# Patient Record
Sex: Female | Born: 1990 | Race: White | Hispanic: No | Marital: Single | Smoking: Former smoker
Health system: Southern US, Community
[De-identification: ages and names within clinical notes are randomized; demographics above are authoritative.]

## PROBLEM LIST (undated history)

## (undated) DIAGNOSIS — U071 COVID-19: Secondary | ICD-10-CM

## (undated) HISTORY — PX: CHOLECYSTECTOMY: SHX55

---

## 2018-08-25 ENCOUNTER — Other Ambulatory Visit: Payer: Self-pay

## 2018-08-25 ENCOUNTER — Emergency Department
Admission: EM | Admit: 2018-08-25 | Discharge: 2018-08-25 | Disposition: A | Discharge status: Home / Self Care | Payer: Self-pay | Attending: Emergency Medicine | Admitting: Emergency Medicine

## 2018-08-25 DIAGNOSIS — B9789 Other viral agents as the cause of diseases classified elsewhere: Secondary | ICD-10-CM | POA: Insufficient documentation

## 2018-08-25 DIAGNOSIS — H6123 Impacted cerumen, bilateral: Secondary | ICD-10-CM | POA: Insufficient documentation

## 2018-08-25 DIAGNOSIS — J069 Acute upper respiratory infection, unspecified: Secondary | ICD-10-CM | POA: Insufficient documentation

## 2018-08-25 MED ORDER — PSEUDOEPH-BROMPHEN-DM 30-2-10 MG/5ML PO SYRP: 5.0000 mL | ORAL_SOLUTION | Freq: Four times a day (QID) | ORAL | 0 refills | Status: AC | PRN

## 2018-08-25 MED ORDER — LIDOCAINE VISCOUS HCL 2 % MT SOLN: 10.0000 mL | OROMUCOSAL | 0 refills | Status: AC | PRN

## 2018-08-25 MED ORDER — CARBAMIDE PEROXIDE 6.5 % OT SOLN: 5.0000 [drp] | Freq: Two times a day (BID) | OTIC | 2 refills | Status: DC

## 2018-08-25 MED ORDER — FLUTICASONE PROPIONATE 50 MCG/ACT NA SUSP: 2.0000 | Freq: Every day | NASAL | 0 refills | Status: DC

## 2018-08-25 NOTE — ED Provider Notes (Signed)
[facility] Emergency Department Provider Note  ____________________________________________  Time seen: Approximately 10:49 AM  I have reviewed the triage vital signs and the nursing notes.   HISTORY  Chief Complaint URI    HPI Carolyn Peters is a 27 y.o. female that presents to the emergency department for evaluation of nasal congestion, sore throat, and cough for 2 days. She has felt warm but not checked her temperature. Her left ear feels clogged. No asthma or allergies. She denies headache, SOB, vomiting, body aches.    No past medical history on file.  There are no active problems to display for this patient.    Prior to Admission medications   Medication Sig Start Date End Date Taking? Authorizing Provider  brompheniramine-pseudoephedrine-DM 30-2-10 MG/5ML syrup Take 5 mLs by mouth 4 (four) times daily as needed. 08/25/18   Laban Emperor, PA-C  carbamide peroxide (DEBROX) 6.5 % OTIC solution Place 5 drops into the right ear 2 (two) times daily. 08/25/18 08/25/19  Laban Emperor, PA-C  fluticasone (FLONASE) 50 MCG/ACT nasal spray Place 2 sprays into both nostrils daily. 08/25/18 08/25/19  Laban Emperor, PA-C  lidocaine (XYLOCAINE) 2 % solution Use as directed 10 mLs in the mouth or throat as needed for mouth pain. 08/25/18   Laban Emperor, PA-C    Allergies Patient has no known allergies.  No family history on file.  Social History Social History   Tobacco Use  . Smoking status: Not on file  Substance Use Topics  . Alcohol use: Not on file  . Drug use: Not on file     Review of Systems  Eyes: No visual changes. No discharge. ENT: Positive for congestion and rhinorrhea. Cardiovascular: No chest pain. Respiratory: Positive for cough. No SOB. Gastrointestinal: No abdominal pain.  No nausea, no vomiting.  No diarrhea.  No constipation. Musculoskeletal: Negative for musculoskeletal pain. Skin: Negative for rash, abrasions, lacerations,  ecchymosis. Neurological: Negative for headaches.   ____________________________________________   PHYSICAL EXAM:  VITAL SIGNS: ED Triage Vitals  Enc Vitals Group     BP 08/25/18 1035 140/73     Pulse Rate 08/25/18 1035 78     Resp 08/25/18 1035 18     Temp 08/25/18 1035 97.7 F (36.5 C)     Temp Source 08/25/18 1035 Oral     SpO2 08/25/18 1035 99 %     Weight 08/25/18 1035 105 lb (47.6 kg)     Height 08/25/18 1035 5\' 2"  (1.575 m)     Head Circumference --      Peak Flow --      Pain Score 08/25/18 1034 5     Pain Loc --      Pain Edu? --      Excl. in [place]? --      Constitutional: Alert and oriented. Well appearing and in no acute distress. Eyes: Conjunctivae are normal. PERRL. EOMI. No discharge. Head: Atraumatic. ENT: No frontal and maxillary sinus tenderness.      Ears: Right tympanic membrane pink. Left tympanic membrane not visualized. Cerumen to bilateral ear canals.      Nose: Mild congestion/rhinnorhea.      Mouth/Throat: Mucous membranes are moist. Oropharynx erythematous. Tonsils not enlarged. No exudates. Uvula midline. Neck: No stridor.   Hematological/Lymphatic/Immunilogical: No cervical lymphadenopathy. Cardiovascular: Normal rate, regular rhythm.  Good peripheral circulation. Respiratory: Normal respiratory effort without tachypnea or retractions. Lungs CTAB. Good air entry to the bases with no decreased or absent breath sounds. Gastrointestinal: Bowel sounds 4 quadrants.  Soft and nontender to palpation. No guarding or rigidity. No palpable masses. No distention. Musculoskeletal: Full range of motion to all extremities. No gross deformities appreciated. Neurologic:  Normal speech and language. No gross focal neurologic deficits are appreciated.  Skin:  Skin is warm, dry and intact. No rash noted. Psychiatric: Mood and affect are normal. Speech and behavior are normal. Patient exhibits appropriate insight and  judgement.   ____________________________________________   LABS (all labs ordered are listed, but only abnormal results are displayed)  Labs Reviewed - No data to display ____________________________________________  EKG   ____________________________________________  RADIOLOGY   No results found.  ____________________________________________    PROCEDURES  Procedure(s) performed:    Procedures    Medications - No data to display   ____________________________________________   INITIAL IMPRESSION / ASSESSMENT AND PLAN / ED COURSE  Pertinent labs & imaging results that were available during my care of the patient were reviewed by me and considered in my medical decision making (see chart for details).  Review of the [place] CSRS was performed in accordance of the [place] prior to dispensing any controlled drugs.     Patient's diagnosis is consistent with viral URI. Vital signs and exam are reassuring. Patient appears well and is staying well hydrated. Patient should alternate tylenol and ibuprofen for fever. Patient feels comfortable going home. Patient will be discharged home with prescriptions for bromfed, flonase, debrox, lidocaine. Patient is to follow up with primary care as needed or otherwise directed. Patient is given ED precautions to return to the ED for any worsening or new symptoms.     ____________________________________________  FINAL CLINICAL IMPRESSION(S) / ED DIAGNOSES  Final diagnoses:  Viral URI with cough  Bilateral impacted cerumen      NEW MEDICATIONS STARTED DURING THIS VISIT:  ED Discharge Orders         Ordered    brompheniramine-pseudoephedrine-DM 30-2-10 MG/5ML syrup  4 times daily PRN     08/25/18 1059    fluticasone (FLONASE) 50 MCG/ACT nasal spray  Daily     08/25/18 1059    carbamide peroxide (DEBROX) 6.5 % OTIC solution  2 times daily     08/25/18 1059    lidocaine (XYLOCAINE) 2 % solution  As needed     08/25/18  1059              This chart was dictated using voice recognition software/Dragon. Despite best efforts to proofread, errors can occur which can change the meaning. Any change was purely unintentional.    Laban Emperor, PA-C 08/25/18 1659    Lavonia Drafts, MD 08/27/18 1616

## 2018-08-25 NOTE — ED Triage Notes (Signed)
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

## 2018-08-25 NOTE — ED Notes (Signed)
See triage note  Presents with sinus drainage and sore throat and subjective fever  Afebrile on arrival

## 2018-12-23 ENCOUNTER — Other Ambulatory Visit: Payer: Self-pay

## 2018-12-23 ENCOUNTER — Encounter (INDEPENDENT_AMBULATORY_CARE_PROVIDER_SITE_OTHER): Payer: Self-pay

## 2018-12-23 ENCOUNTER — Ambulatory Visit
Admission: RE | Admit: 2018-12-23 | Discharge: 2018-12-23 | Disposition: A | Discharge status: Home / Self Care | Payer: Self-pay | Source: Ambulatory Visit | Attending: Oncology | Admitting: Oncology

## 2018-12-23 ENCOUNTER — Ambulatory Visit: Payer: Self-pay | Attending: Oncology | Admitting: *Deleted

## 2018-12-23 ENCOUNTER — Encounter: Payer: Self-pay | Admitting: *Deleted

## 2018-12-23 VITALS — BP 120/87 | HR 87 | Temp 98.1°F | Ht 64.0 in | Wt 120.5 lb

## 2018-12-23 DIAGNOSIS — N63 Unspecified lump in unspecified breast: Secondary | ICD-10-CM

## 2018-12-23 NOTE — Patient Instructions (Signed)
Gave patient hand-out, Women Staying Healthy, Active and Well from [facility], with education on breast health, pap smears, heart and colon health. 

## 2018-12-23 NOTE — Progress Notes (Signed)
  Subjective:     Patient ID: Carolyn Peters, female   DOB: 1991-01-18, 28 y.o.   MRN: [id number]  HPI   Review of Systems     Objective:   Physical Exam Chest:     Breasts:        Right: No swelling, bleeding, inverted nipple, mass, nipple discharge, skin change or tenderness.        Left: Mass present. No swelling, bleeding, inverted nipple, nipple discharge, skin change or tenderness.    Lymphadenopathy:     Upper Body:     Right upper body: No supraclavicular or axillary adenopathy.     Left upper body: No supraclavicular or axillary adenopathy.        Assessment:     28 year old White female referred to [facility] by Jerline Pain, FNP at the [facility] Department for further evaluation of a left breast mass.  Patient states she had not felt the lump until found on clinical exam on 12/04/18.  States it is unchanged since then.  Family history includes maternal and paternal grandmother with breast cancer.  On clinical breast exam today I can palpate an approximate 0.5 cm firm, mobile nodule at 6:00 left breast at the edge of the areola.  Taught self breast awareness.  Patient has been screened for eligibility.  She does not have any insurance, Medicare or Medicaid.  She also meets financial eligibility.  Hand-out given on the Affordable Care Act.    Plan:     Ultrasound of the left breast ordered.  Will follow up per [facility] protocol and radiology recommendations.

## 2018-12-31 ENCOUNTER — Encounter: Payer: Self-pay | Admitting: *Deleted

## 2018-12-31 NOTE — Progress Notes (Signed)
Called patient and reviewed her ultrasound results.  She states the left breast mass is still there and is tender to palpation.  Offered surgical consult for second opinion.  States she has Medicaid now.  She can be billed through her Medicaid card.  Appointment scheduled to see Dr. Bary Castilla on 01/05/19 @ 3:00.  She is to take a photo ID, all meds and arrive 15 minutes early.  She is agreeable.

## 2019-01-05 ENCOUNTER — Ambulatory Visit (INDEPENDENT_AMBULATORY_CARE_PROVIDER_SITE_OTHER): Payer: Medicaid Other | Admitting: General Surgery

## 2019-01-05 ENCOUNTER — Other Ambulatory Visit: Payer: Self-pay

## 2019-01-05 ENCOUNTER — Encounter: Payer: Self-pay | Admitting: General Surgery

## 2019-01-05 VITALS — BP 121/83 | HR 69 | Temp 98.6°F | Resp 16 | Ht 63.0 in | Wt 121.0 lb

## 2019-01-05 DIAGNOSIS — N6323 Unspecified lump in the left breast, lower outer quadrant: Secondary | ICD-10-CM | POA: Diagnosis not present

## 2019-01-05 DIAGNOSIS — N6320 Unspecified lump in the left breast, unspecified quadrant: Secondary | ICD-10-CM

## 2019-01-05 NOTE — Patient Instructions (Addendum)
Please call our office if you have questions or concerns.    Breast Self-Awareness Breast self-awareness means:  Knowing how your breasts look.  Knowing how your breasts feel.  Checking your breasts every month for changes.  Telling your doctor if you notice a change in your breasts. Breast self-awareness allows you to notice a breast problem early while it is still small. How to do a breast self-exam One way to learn what is normal for your breasts and to check for changes is to do a breast self-exam. To do a breast self-exam: Look for Changes  1. Take off all the clothes above your waist. 2. Stand in front of a mirror in a room with good lighting. 3. Put your hands on your hips. 4. Push your hands down. 5. Look at your breasts and nipples in the mirror to see if one breast or nipple looks different than the other. Check to see if: ? The shape of one breast is different. ? The size of one breast is different. ? There are wrinkles, dips, and bumps in one breast and not the other. 6. Look at each breast for changes in your skin, such as: ? Redness. ? Scaly areas. 7. Look for changes in your nipples, such as: ? Liquid around the nipples. ? Bleeding. ? Dimpling. ? Redness. ? A change in where the nipples are. Feel for Changes 1. Lie on your back on the floor. 2. Feel each breast. To do this, follow these steps: ? Pick a breast to feel. ? Put the arm closest to that breast above your head. ? Use your other arm to feel the nipple area of your breast. Feel the area with the pads of your three middle fingers by making small circles with your fingers. For the first circle, press lightly. For the second circle, press harder. For the third circle, press even harder. ? Keep making circles with your fingers at the light, harder, and even harder pressures as you move down your breast. Stop when you feel your ribs. ? Move your fingers a little toward the center of your body. ? Start  making circles with your fingers again, this time going up until you reach your collarbone. ? Keep making up and down circles until you reach your armpit. Remember to keep using the three pressures. ? Feel the other breast in the same way. 3. Sit or stand in the shower or tub. 4. With soapy water on your skin, feel each breast the same way you did in step 2, when you were lying on the floor. Write Down What You Find After doing the self-exam, write down:  What is normal for each breast.  Any changes you find in each breast.  When you last had your period.  How often should I check my breasts? Check your breasts every month. If you are breastfeeding, the best time to check them is after you feed your baby or after you use a breast pump. If you get periods, the best time to check your breasts is 5-7 days after your period is over. When should I see my doctor? See your doctor if you notice:  A change in shape or size of your breasts or nipples.  A change in the skin of your breast or nipples, such as red or scaly skin.  Unusual fluid coming from your nipples.  A lump or thick area that was not there before.  Pain in your breasts.  Anything that concerns you.  This information is not intended to replace advice given to you by your health care provider. Make sure you discuss any questions you have with your health care provider. Document Released: 03/25/2008 Document Revised: 03/14/2016 Document Reviewed: 08/27/2015 Elsevier Interactive Patient Education  2019 [employer].

## 2019-01-05 NOTE — Progress Notes (Signed)
Patient ID: Carolyn Peters, female   DOB: [birth year], 28 y.o.   MRN: [id number]  Chief Complaint  Patient presents with  . Other    left breast mass    HPI Carolyn Peters is a 28 y.o. female.  Here today for left breast mass. Tender to touch. No nipple drainage. No skin changes or dimpling. She just recently found it. She does not do self breast exams regularly.  Both Paternal and Maternal grandmother history of breast cancer.  HPI  History reviewed. No pertinent past medical history.  History reviewed. No pertinent surgical history.  History reviewed. No pertinent family history.  Social History Social History   Tobacco Use  . Smoking status: Current Every Day Smoker    Packs/day: 1.00    Years: 15.00    Pack years: 15.00  . Smokeless tobacco: Never Used  Substance Use Topics  . Alcohol use: Yes  . Drug use: Never    No Known Allergies  Current Outpatient Medications  Medication Sig Dispense Refill  . brompheniramine-pseudoephedrine-DM 30-2-10 MG/5ML syrup Take 5 mLs by mouth 4 (four) times daily as needed. 120 mL 0  . lidocaine (XYLOCAINE) 2 % solution Use as directed 10 mLs in the mouth or throat as needed for mouth pain. 100 mL 0   No current facility-administered medications for this visit.     Review of Systems Review of Systems  Constitutional: Negative.   Respiratory: Negative.   Cardiovascular: Negative.     Blood pressure 121/83, pulse 69, temperature 98.6 F (37 C), temperature source Temporal, resp. rate 16, height 5\' 3"  (1.6 m), weight 121 lb (54.9 kg), last menstrual period 12/07/2018, SpO2 98 %.  Physical Exam Physical Exam Exam conducted with a chaperone present.  Constitutional:      Appearance: She is well-developed.  Eyes:     General: No scleral icterus.    Conjunctiva/sclera: Conjunctivae normal.  Neck:     Musculoskeletal: Normal range of motion.  Cardiovascular:     Rate and Rhythm: Normal rate and regular rhythm.     Heart  sounds: Normal heart sounds.  Pulmonary:     Effort: Pulmonary effort is normal.     Breath sounds: Normal breath sounds.  Chest:    Lymphadenopathy:     Cervical: No cervical adenopathy.  Skin:    General: Skin is warm and dry.  Neurological:     Mental Status: She is alert and oriented to person, place, and time.     Data Reviewed Breast ultrasound dated December 23, 2018 was reported as negative.  No images for review.  Assessment Minimally asymptomatic breast parenchyma and a very thin individual, no suspicion for a pathologic process.  Plan  The patient is encourage to continue d regular self examination.  Avoidance of manipulation of this area to minimize enlargement or anxiety encouraged.    HPI, Physical Exam, Assessment and Plan have been scribed under the direction and in the presence of Carolyn Bellow, MD. Carolyn Peters, CMA  I have completed the exam and reviewed the above documentation for accuracy and completeness.  I agree with the above.  Carolyn Peters has been used and any errors in dictation or transcription are unintentional.  Carolyn Peters, M.D., F.A.C.S.  Carolyn Peters 01/06/2019, 2:51 PM

## 2019-01-06 DIAGNOSIS — N6320 Unspecified lump in the left breast, unspecified quadrant: Secondary | ICD-10-CM | POA: Insufficient documentation

## 2019-01-19 ENCOUNTER — Encounter: Payer: Self-pay | Admitting: *Deleted

## 2019-01-19 NOTE — Progress Notes (Signed)
Benign findings per Dr. Bary Castilla.  Follow up as needed.  [facility] to [facility].

## 2019-10-26 ENCOUNTER — Other Ambulatory Visit: Payer: Self-pay | Admitting: Family Medicine

## 2019-10-26 DIAGNOSIS — N644 Mastodynia: Secondary | ICD-10-CM

## 2019-10-26 DIAGNOSIS — N6320 Unspecified lump in the left breast, unspecified quadrant: Secondary | ICD-10-CM

## 2019-10-26 DIAGNOSIS — N6452 Nipple discharge: Secondary | ICD-10-CM

## 2019-10-28 ENCOUNTER — Other Ambulatory Visit: Payer: Self-pay | Admitting: Family Medicine

## 2019-10-28 DIAGNOSIS — R7989 Other specified abnormal findings of blood chemistry: Secondary | ICD-10-CM

## 2019-11-03 ENCOUNTER — Ambulatory Visit
Admission: RE | Admit: 2019-11-03 | Discharge: 2019-11-03 | Disposition: A | Discharge status: Home / Self Care | Payer: Medicaid Other | Source: Ambulatory Visit | Attending: Family Medicine | Admitting: Family Medicine

## 2019-11-03 ENCOUNTER — Other Ambulatory Visit: Payer: Self-pay

## 2019-11-03 DIAGNOSIS — R7989 Other specified abnormal findings of blood chemistry: Secondary | ICD-10-CM | POA: Diagnosis present

## 2019-11-09 ENCOUNTER — Encounter: Payer: Self-pay | Admitting: Radiology

## 2019-11-09 ENCOUNTER — Ambulatory Visit
Admission: RE | Admit: 2019-11-09 | Discharge: 2019-11-09 | Disposition: A | Discharge status: Home / Self Care | Payer: Medicaid Other | Source: Ambulatory Visit | Attending: Family Medicine | Admitting: Family Medicine

## 2019-11-09 DIAGNOSIS — N6452 Nipple discharge: Secondary | ICD-10-CM

## 2019-11-09 DIAGNOSIS — N644 Mastodynia: Secondary | ICD-10-CM

## 2019-11-09 DIAGNOSIS — N6325 Unspecified lump in the left breast, overlapping quadrants: Secondary | ICD-10-CM | POA: Insufficient documentation

## 2019-11-09 DIAGNOSIS — N6320 Unspecified lump in the left breast, unspecified quadrant: Secondary | ICD-10-CM

## 2020-06-16 ENCOUNTER — Other Ambulatory Visit: Payer: Self-pay

## 2020-06-16 ENCOUNTER — Emergency Department: Payer: Medicaid Other

## 2020-06-16 DIAGNOSIS — N644 Mastodynia: Secondary | ICD-10-CM | POA: Insufficient documentation

## 2020-06-16 DIAGNOSIS — M542 Cervicalgia: Secondary | ICD-10-CM | POA: Insufficient documentation

## 2020-06-16 DIAGNOSIS — Z5321 Procedure and treatment not carried out due to patient leaving prior to being seen by health care provider: Secondary | ICD-10-CM | POA: Diagnosis not present

## 2020-06-16 DIAGNOSIS — R0602 Shortness of breath: Secondary | ICD-10-CM | POA: Insufficient documentation

## 2020-06-16 LAB — COMPREHENSIVE METABOLIC PANEL
ALT: 43 U/L (ref 0–44)
AST: 36 U/L (ref 15–41)
Albumin: 4.1 g/dL (ref 3.5–5.0)
Alkaline Phosphatase: 114 U/L (ref 38–126)
Anion gap: 9 (ref 5–15)
BUN: 6 mg/dL (ref 6–20)
CO2: 24 mmol/L (ref 22–32)
Calcium: 8.5 mg/dL — ABNORMAL LOW (ref 8.9–10.3)
Chloride: 105 mmol/L (ref 98–111)
Creatinine, Ser: 0.72 mg/dL (ref 0.44–1.00)
GFR calc Af Amer: >60 mL/min (ref >60)
GFR calc non Af Amer: >60 mL/min (ref >60)
Glucose, Bld: 93 mg/dL (ref 70–99)
Potassium: 3.2 mmol/L — ABNORMAL LOW (ref 3.5–5.1)
Sodium: 138 mmol/L (ref 135–145)
Total Bilirubin: 0.6 mg/dL (ref 0.3–1.2)
Total Protein: 7 g/dL (ref 6.5–8.1)

## 2020-06-16 LAB — TROPONIN I (HIGH SENSITIVITY): Troponin I (High Sensitivity): 4 ng/L (ref <18)

## 2020-06-16 LAB — FIBRIN DERIVATIVES D-DIMER (ARMC ONLY): Fibrin derivatives D-dimer (ARMC): 405.9 ng/mL (FEU) (ref 0.00–499.00)

## 2020-06-16 LAB — CBC
HCT: 43.4 % (ref 36.0–46.0)
Hemoglobin: 14.3 g/dL (ref 12.0–15.0)
MCH: 31.8 pg (ref 26.0–34.0)
MCHC: 32.9 g/dL (ref 30.0–36.0)
MCV: 96.4 fL (ref 80.0–100.0)
Platelets: 390 10*3/uL (ref 150–400)
RBC: 4.5 MIL/uL (ref 3.87–5.11)
RDW: 13.2 % (ref 11.5–15.5)
WBC: 14 10*3/uL — ABNORMAL HIGH (ref 4.0–10.5)
nRBC: 0 % (ref 0.0–0.2)

## 2020-06-16 NOTE — ED Triage Notes (Signed)
Pt states she is having pain in left breast and under left breast with shob since yesterday. Pt denies vomiting, states pain extends up to left lateral neck. Pt denies vomiting. Pt states she does feel shob.

## 2020-06-17 ENCOUNTER — Emergency Department
Admission: EM | Admit: 2020-06-17 | Discharge: 2020-06-17 | Disposition: A | Discharge status: Home / Self Care | Payer: Medicaid Other | Attending: Emergency Medicine | Admitting: Emergency Medicine

## 2020-09-22 ENCOUNTER — Emergency Department
Admission: EM | Admit: 2020-09-22 | Discharge: 2020-09-22 | Disposition: A | Discharge status: Home / Self Care | Payer: Medicaid Other | Attending: Emergency Medicine | Admitting: Emergency Medicine

## 2020-09-22 ENCOUNTER — Other Ambulatory Visit: Payer: Self-pay

## 2020-09-22 ENCOUNTER — Emergency Department: Payer: Medicaid Other

## 2020-09-22 ENCOUNTER — Encounter: Payer: Self-pay | Admitting: Emergency Medicine

## 2020-09-22 DIAGNOSIS — C715 Malignant neoplasm of cerebral ventricle: Secondary | ICD-10-CM | POA: Insufficient documentation

## 2020-09-22 DIAGNOSIS — I517 Cardiomegaly: Secondary | ICD-10-CM | POA: Diagnosis not present

## 2020-09-22 DIAGNOSIS — U071 COVID-19: Secondary | ICD-10-CM | POA: Insufficient documentation

## 2020-09-22 DIAGNOSIS — Z87891 Personal history of nicotine dependence: Secondary | ICD-10-CM | POA: Diagnosis not present

## 2020-09-22 DIAGNOSIS — Q208 Other congenital malformations of cardiac chambers and connections: Secondary | ICD-10-CM

## 2020-09-22 DIAGNOSIS — R519 Headache, unspecified: Secondary | ICD-10-CM | POA: Diagnosis present

## 2020-09-22 LAB — RESP PANEL BY RT-PCR (FLU A&B, COVID) ARPGX2
Influenza A by PCR: NEGATIVE
Influenza B by PCR: NEGATIVE
SARS Coronavirus 2 by RT PCR: POSITIVE — AB

## 2020-09-22 MED ORDER — ONDANSETRON 8 MG PO TBDP
8.0000 mg | ORAL_TABLET | Freq: Once | ORAL | Status: AC
  Administered 2020-09-22: 8 mg via ORAL
  Filled 2020-09-22: qty 1

## 2020-09-22 MED ORDER — ACETAMINOPHEN 500 MG PO TABS
1000.0000 mg | ORAL_TABLET | Freq: Once | ORAL | Status: AC
  Administered 2020-09-22: 1000 mg via ORAL
  Filled 2020-09-22: qty 2

## 2020-09-22 NOTE — ED Triage Notes (Signed)
Patient ambulatory to triage with steady gait, without difficulty or distress noted; pt reports HA since yesterday accomp by nausea and chills

## 2020-09-22 NOTE — Discharge Instructions (Signed)
Follow discharge care instructions. 

## 2020-09-22 NOTE — ED Provider Notes (Signed)
[facility] Emergency Department Provider Note   ____________________________________________   First MD Initiated Contact with Patient 09/22/20 [phone]     (approximate)  I have reviewed the triage vital signs and the nursing notes.   HISTORY  Chief Complaint No chief complaint on file.    HPI Carolyn Peters is a 29 y.o. female patient presents with increasing headache over the past 4 days.  Patient is a headache associated with nausea and vomiting.  Patient states fever, chills, and body aches.  Denies recent travel or known contact with COVID-19. Patient has not taken COVID-19 vaccine.  History reviewed. No pertinent past medical history.  Patient Active Problem List   Diagnosis Date Noted  . Breast mass, left 01/06/2019    History reviewed. No pertinent surgical history.  Prior to Admission medications   Medication Sig Start Date End Date Taking? Authorizing Provider  brompheniramine-pseudoephedrine-DM 30-2-10 MG/5ML syrup Take 5 mLs by mouth 4 (four) times daily as needed. 08/25/18   Laban Emperor, PA-C  lidocaine (XYLOCAINE) 2 % solution Use as directed 10 mLs in the mouth or throat as needed for mouth pain. 08/25/18   Laban Emperor, PA-C    Allergies Patient has no known allergies.  Family History  Problem Relation Age of Onset  . Breast cancer Maternal Grandmother   . Breast cancer Paternal Grandmother     Social History Social History   Tobacco Use  . Smoking status: Former Smoker    Packs/day: 1.00    Years: 15.00    Pack years: 15.00  . Smokeless tobacco: Never Used  Substance Use Topics  . Alcohol use: Yes  . Drug use: Never    Review of Systems Constitutional: Fever/chills.  Body aches. Eyes: No visual changes. ENT: No sore throat. Cardiovascular: Denies chest pain. Respiratory: Denies shortness of breath. Gastrointestinal: No abdominal pain.  Nausea and vomiting.  No diarrhea.  No constipation. Genitourinary:  Negative for dysuria. Musculoskeletal: Negative for back pain. Skin: Negative for rash. Neurological: Positive for headaches, but denies focal weakness or numbness.   ____________________________________________   PHYSICAL EXAM:  VITAL SIGNS: ED Triage Vitals  Enc Vitals Group     BP 09/22/20 0648 (!) 143/93     Pulse Rate 09/22/20 0648 (!) 114     Resp 09/22/20 0648 18     Temp 09/22/20 0648 (!) 101.5 F (38.6 C)     Temp Source 09/22/20 0648 Oral     SpO2 09/22/20 0648 97 %     Weight 09/22/20 0645 155 lb (70.3 kg)     Height 09/22/20 0645 5\' 2"  (1.575 m)     Head Circumference --      Peak Flow --      Pain Score 09/22/20 0644 6     Pain Loc --      Pain Edu? --      Excl. in [place]? --     Constitutional: Alert and oriented. Well appearing and in no acute distress.  Febrile Eyes: Conjunctivae are normal. PERRL. EOMI. Head: Atraumatic. Nose: No congestion/rhinnorhea.  Left maxillary sinus guarding with palpation. Mouth/Throat: Mucous membranes are moist.  Oropharynx non-erythematous. Neck: No stridor.  Hematological/Lymphatic/Immunilogical: No cervical lymphadenopathy. Cardiovascular: Tachycardic, regular rhythm. Grossly normal heart sounds.  Good peripheral circulation. Respiratory: Normal respiratory effort.  No retractions. Lungs CTAB. Gastrointestinal: Soft and nontender. No distention. No abdominal bruits. No CVA tenderness. Musculoskeletal: No lower extremity tenderness nor edema.  No joint effusions. Neurologic:  Normal speech and language. No  gross focal neurologic deficits are appreciated. No gait instability. Skin:  Skin is warm, dry and intact. No rash noted. Psychiatric: Mood and affect are normal. Speech and behavior are normal.  ____________________________________________   LABS (all labs ordered are listed, but only abnormal results are displayed)  Labs Reviewed  RESP PANEL BY RT-PCR (FLU A&B, COVID) ARPGX2 - Abnormal; Notable for the following  components:      Result Value   SARS Coronavirus 2 by RT PCR POSITIVE (*)    All other components within normal limits   ____________________________________________  EKG   ____________________________________________  RADIOLOGY I, Sable Feil, personally viewed and evaluated these images (plain radiographs) as part of my medical decision making, as well as reviewing the written report by the radiologist.  ED MD interpretation:    Official radiology report(s): CT Head Wo Contrast  Result Date: 09/22/2020 CLINICAL DATA:  Headache, intracranial hemorrhage suspected. Left-sided headache. EXAM: CT HEAD WITHOUT CONTRAST TECHNIQUE: Contiguous axial images were obtained from the base of the skull through the vertex without intravenous contrast. COMPARISON:  None. FINDINGS: Brain: No acute infarct or intracranial hemorrhage. No mass lesion. No midline shift, ventriculomegaly or extra-axial fluid collection. Partial effacement of the right lateral ventricle. Vascular: No hyperdense vessel or unexpected calcification. Skull: Negative for fracture or focal lesion. Sinuses/Orbits: Normal orbits. Clear paranasal sinuses. No mastoid effusion. Other: None. IMPRESSION: 1. No intracranial hemorrhage or acute infarct. 2. Asymmetric partial effacement of the right lateral ventricle. Consider MRI brain for further evaluation. Electronically Signed   By: Primitivo Gauze M.D.   On: 09/22/2020 07:57    ____________________________________________   PROCEDURES  Procedure(s) performed (including Critical Care):  Procedures   ____________________________________________   INITIAL IMPRESSION / ASSESSMENT AND PLAN / ED COURSE  As part of my medical decision making, I reviewed the following data within the [place]         Patient presents with fever, chills, nausea, and headache.  Patient test positive for COVID-19.  A CT scan was was ordered secondary to patient complaint of  increasing left headache.  Discussed CT findings showing a right ventricle effacement.  Discussed with patient rationale for getting MRI after her self quarantine for COVID-19.  Advised patient to discuss her CT finding for PCP.  Patient was understanding recommendations.  Return right ED if condition worsens.      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 She was evaluated in the context of the global COVID-19 pandemic, which necessitated consideration that the patient might be at risk for infection with the SARS-CoV-2 virus that causes COVID-19. Institutional protocols and algorithms that pertain to the evaluation of patients at risk for COVID-19 are in a state of rapid change based on information released by regulatory bodies including the CDC and federal and state organizations. These policies and algorithms were followed during the patient's care in the ED.  Some ED evaluations and interventions may be delayed as a result of limited staffing during and the pandemic.*   Note:  This document was prepared using Dragon voice recognition software and may include unintentional dictation errors.    Mardee Postin  K, PA-C 09/22/20 2751    Lavonia Drafts, MD 09/22/20 1155

## 2020-09-23 ENCOUNTER — Telehealth (HOSPITAL_COMMUNITY): Payer: Self-pay | Admitting: Family

## 2020-09-23 DIAGNOSIS — U071 COVID-19: Secondary | ICD-10-CM

## 2020-09-23 NOTE — Telephone Encounter (Signed)
Called to discuss with Osborne Oman about Covid symptoms and potential candidacy for the use of sotrovimab, a combination monoclonal antibody infusion for those with mild to moderate Covid symptoms and at a high risk of hospitalization.     Pt is qualified for this infusion at the infusion center due to co-morbid conditions and/or a member of an at-risk group, however unable to reach patient and was unable to leave a VM as a message was received that VM box has not been set up.   Shalva Rozycki,NP

## 2020-10-06 ENCOUNTER — Other Ambulatory Visit: Payer: Self-pay | Admitting: Family Medicine

## 2020-10-06 DIAGNOSIS — R9089 Other abnormal findings on diagnostic imaging of central nervous system: Secondary | ICD-10-CM

## 2020-10-12 ENCOUNTER — Ambulatory Visit
Admission: RE | Admit: 2020-10-12 | Discharge: 2020-10-12 | Disposition: A | Discharge status: Home / Self Care | Payer: Medicaid Other | Source: Ambulatory Visit | Attending: Family Medicine | Admitting: Family Medicine

## 2020-10-12 ENCOUNTER — Other Ambulatory Visit: Payer: Self-pay

## 2020-10-12 DIAGNOSIS — R9089 Other abnormal findings on diagnostic imaging of central nervous system: Secondary | ICD-10-CM | POA: Diagnosis present

## 2021-09-06 IMAGING — MR MR HEAD W/O CM
10 series · 48 of 48 positions shown · non-contrast
Comparison: 09/22/2020.

CLINICAL DATA: Headaches.

EXAM:
MRI HEAD WITHOUT CONTRAST
TECHNIQUE: Multiplanar, multiecho pulse sequences of the brain and surrounding
structures were obtained without intravenous contrast.

[Series 2: T1 · sagittal · 5.0mm · 0.45mm/px · 3 of 23 slices shown (1 of 2)]
[im 1/23]
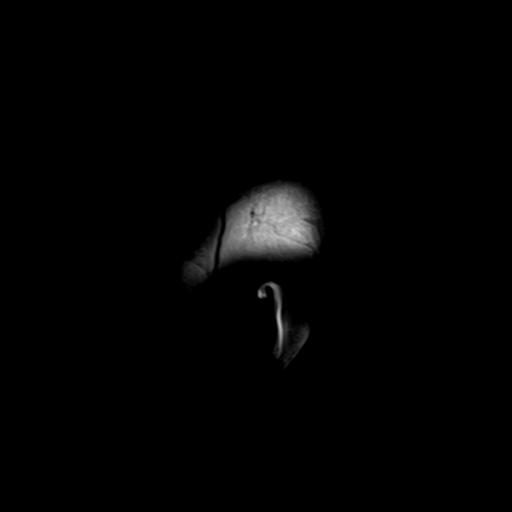
[im 12/23]
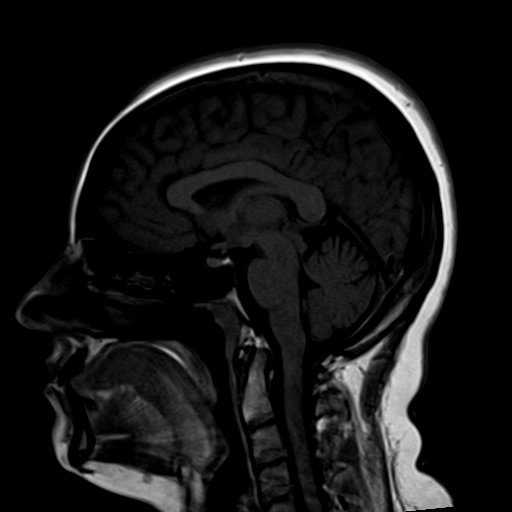
[im 23/23]
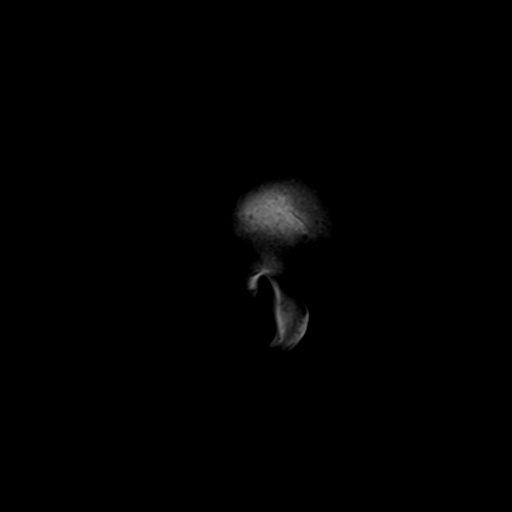

[Series 4: DWI · axial · 3.0mm · 1.20mm/px · z∈[-76,+86]mm · 5 of 55 slices shown (1 of 4)]
[im 1/55]
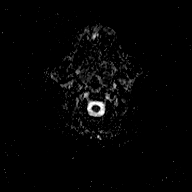
[im 14/55]
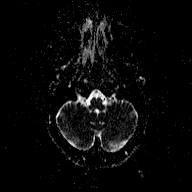
[im 28/55]
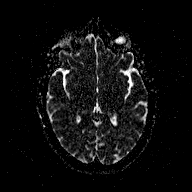
[im 41/55]
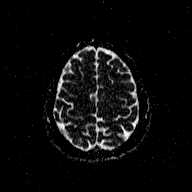
[im 55/55]
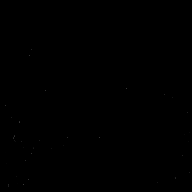

[Series 6: DWI · coronal · 3.0mm · 1.15mm/px · 4 of 45 slices shown (2 of 4)]
[im 1/45]
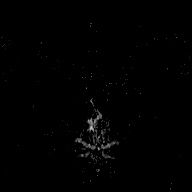
[im 15/45]
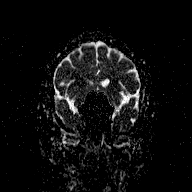
[im 30/45]
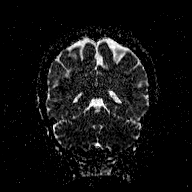
[im 45/45]
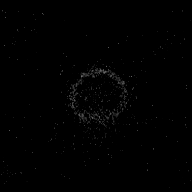

[Series 7: T2 · axial · 5.0mm · 0.72mm/px · z∈[-72,+82]mm · 2 of 23 slices shown (1 of 3)]
[im 1/23]
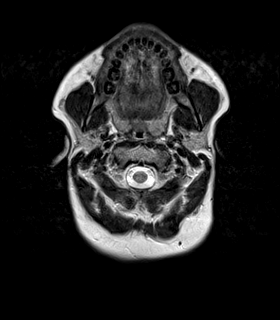
[im 23/23]
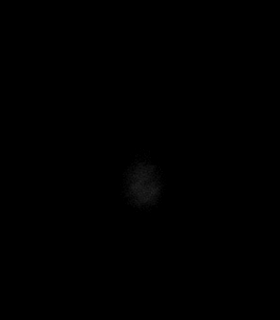

[Series 9: T2 · axial · 5.0mm · 0.72mm/px · z∈[-72,+82]mm · 2 of 23 slices shown (2 of 3)]
[im 1/23]
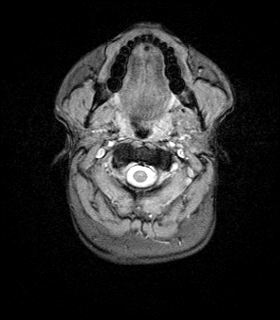
[im 23/23]
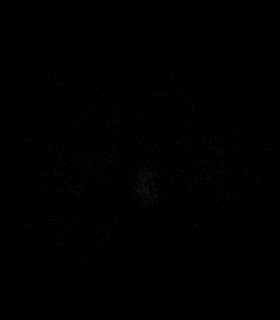

[Series 10: T1 · axial · 1.0mm · 1.00mm/px · z∈[-75,+84]mm · 15 of 160 slices shown (2 of 2)]
[im 1/160]
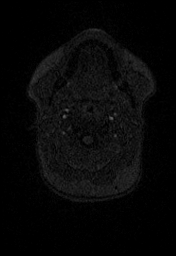
[im 12/160]
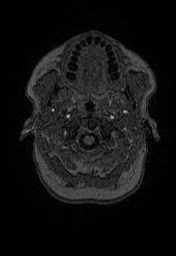
[im 23/160]
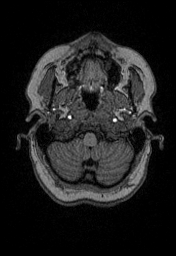
[im 35/160]
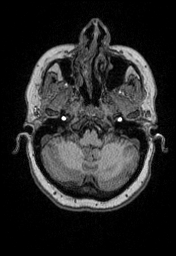
[im 46/160]
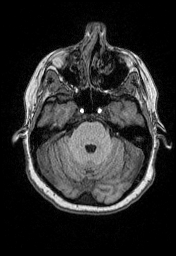
[im 57/160]
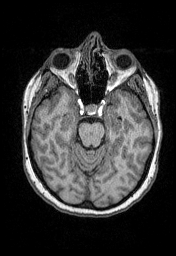
[im 69/160]
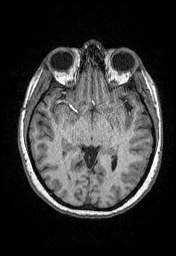
[im 80/160]
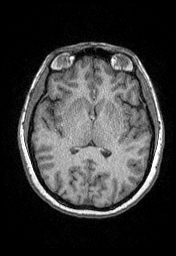
[im 91/160]
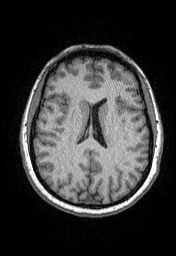
[im 103/160]
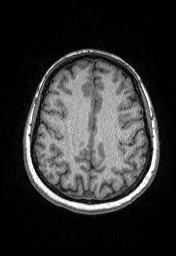
[im 114/160]
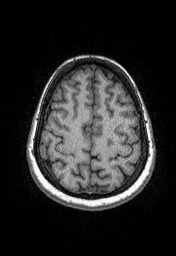
[im 125/160]
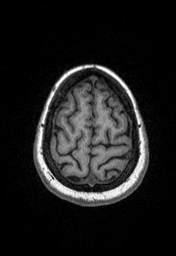
[im 137/160]
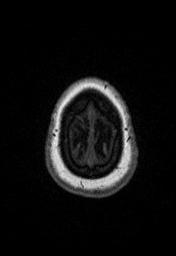
[im 148/160]
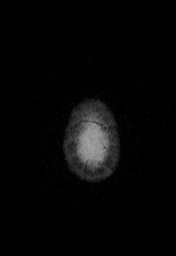
[im 160/160]
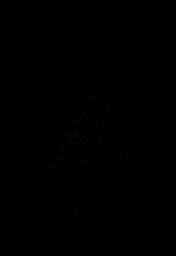

[Series 11: T2 · coronal · 5.0mm · 0.43mm/px · 3 of 28 slices shown (3 of 3)]
[im 1/28]
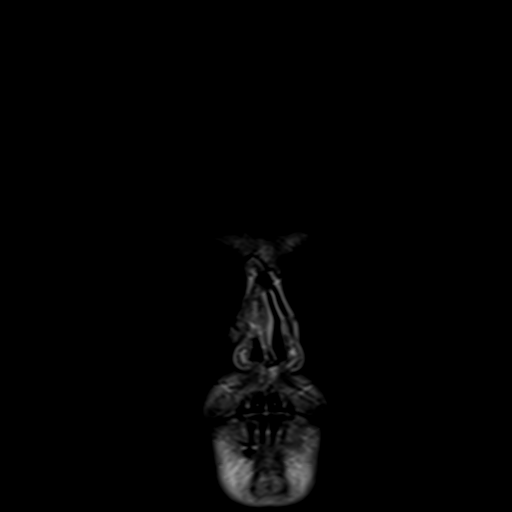
[im 14/28]
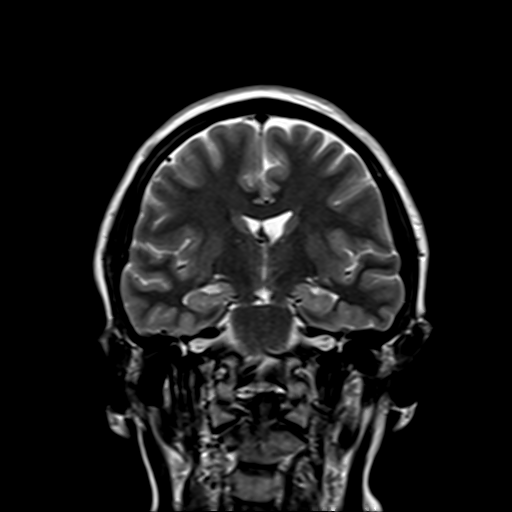
[im 28/28]
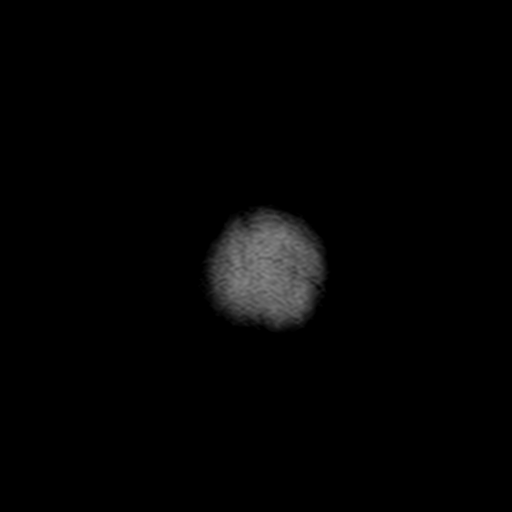

[Series 12: FLAIR · axial · 3.0mm · 0.45mm/px · z∈[-76,+86]mm · 5 of 55 slices shown]
[im 1/55]
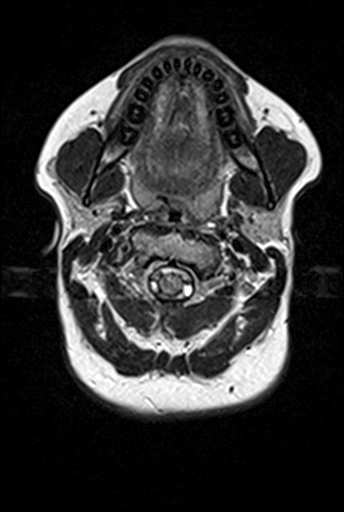
[im 14/55]
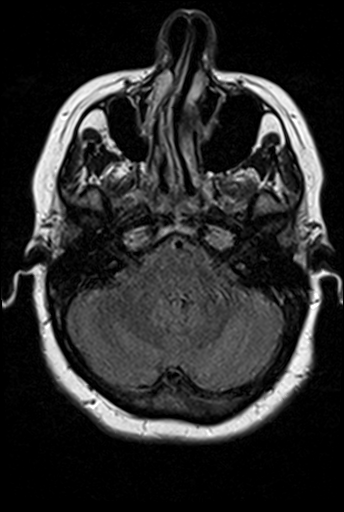
[im 28/55]
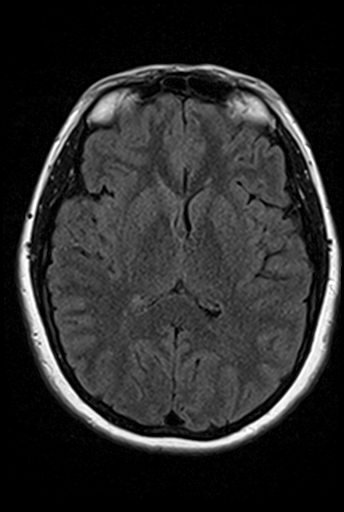
[im 41/55]
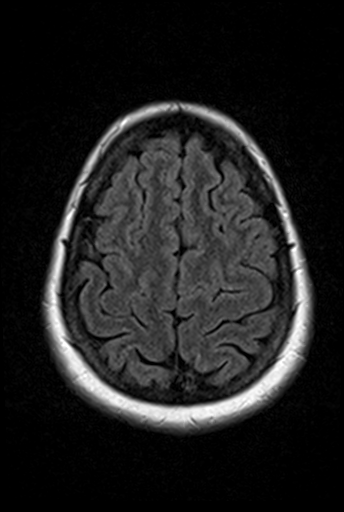
[im 55/55]
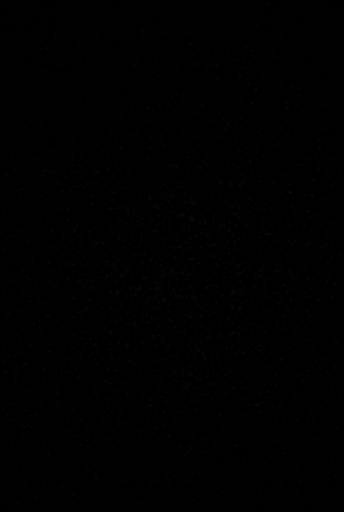

[Series 100: DWI · axial · 3.0mm · 1.20mm/px · z∈[-76,+86]mm · 5 of 55 slices shown (3 of 4)]
[im 1/55]
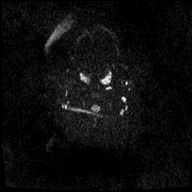
[im 14/55]
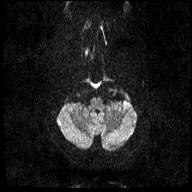
[im 28/55]
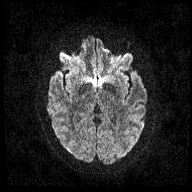
[im 41/55]
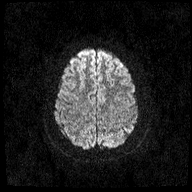
[im 55/55]
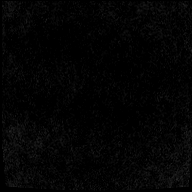

[Series 101: DWI · coronal · 3.0mm · 1.15mm/px · 4 of 45 slices shown (4 of 4)]
[im 1/45]
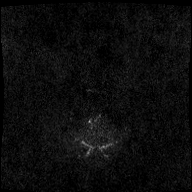
[im 15/45]
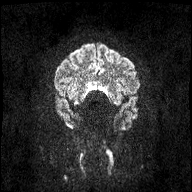
[im 30/45]
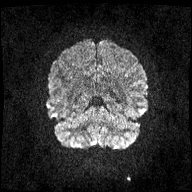
[im 45/45]
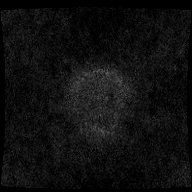

[48 of 48 positions shown; findings below may reference images not displayed]

FINDINGS: Brain: No diffusion-weighted signal abnormality. No intracranial
hemorrhage. No midline shift, ventriculomegaly or extra-axial fluid
collection. No mass lesion.

Vascular: Normal flow voids.

Skull and upper cervical spine: Normal marrow signal.

Sinuses/Orbits: Normal orbits. Small maxillary sinus mucous
retention cysts. No mastoid effusion.

Other: None.
IMPRESSION: Normal MRI brain.

## 2021-10-04 ENCOUNTER — Other Ambulatory Visit: Payer: Self-pay

## 2021-10-04 ENCOUNTER — Emergency Department
Admission: EM | Admit: 2021-10-04 | Discharge: 2021-10-04 | Disposition: A | Discharge status: Home / Self Care | Payer: Medicaid Other | Attending: Emergency Medicine | Admitting: Emergency Medicine

## 2021-10-04 ENCOUNTER — Encounter: Payer: Self-pay | Admitting: Emergency Medicine

## 2021-10-04 DIAGNOSIS — U071 COVID-19: Secondary | ICD-10-CM | POA: Diagnosis not present

## 2021-10-04 DIAGNOSIS — Z87891 Personal history of nicotine dependence: Secondary | ICD-10-CM | POA: Diagnosis not present

## 2021-10-04 DIAGNOSIS — Z28310 Unvaccinated for covid-19: Secondary | ICD-10-CM | POA: Diagnosis not present

## 2021-10-04 DIAGNOSIS — Z8616 Personal history of COVID-19: Secondary | ICD-10-CM | POA: Diagnosis not present

## 2021-10-04 DIAGNOSIS — J029 Acute pharyngitis, unspecified: Secondary | ICD-10-CM | POA: Diagnosis present

## 2021-10-04 HISTORY — DX: COVID-19: U07.1

## 2021-10-04 LAB — RESP PANEL BY RT-PCR (FLU A&B, COVID) ARPGX2
Influenza A by PCR: NEGATIVE
Influenza B by PCR: NEGATIVE
SARS Coronavirus 2 by RT PCR: POSITIVE — AB

## 2021-10-04 LAB — GROUP A STREP BY PCR: Group A Strep by PCR: NOT DETECTED

## 2021-10-04 MED ORDER — ONDANSETRON 4 MG PO TBDP: ORAL_TABLET | ORAL | 0 refills | Status: AC

## 2021-10-04 NOTE — ED Provider Notes (Signed)
[facility] Emergency Department Provider Note  ____________________________________________   Event Date/Time   First MD Initiated Contact with Patient 10/04/21 0118     (approximate)  I have reviewed the triage vital signs and the nursing notes.   HISTORY  Chief Complaint Generalized Body Aches    HPI Carolyn Peters is a 30 y.o. female who has not been vaccinated against COVID-19 but who was diagnosed with COVID-19 almost exactly a year ago on 09/22/2020.  She presents for evaluation of about 2 days of viral symptoms including generalized body aches, general malaise, mild sore throat, occasional cough.  No neck pain, no fever.  She has had a couple of episodes of nausea and vomiting but is not currently nauseated.  No abdominal pain, no chest pain, no shortness of breath.  Symptoms are mild to moderate and nothing in particular makes them better or worse.  She has not been around anyone who she knows has been ill.     Past Medical History:  Diagnosis Date   COVID-19    diagnosed on 09/22/2020 and again on 10/04/2021    Patient Active Problem List   Diagnosis Date Noted   Breast mass, left 01/06/2019    Past Surgical History:  Procedure Laterality Date   CHOLECYSTECTOMY      Prior to Admission medications   Medication Sig Start Date End Date Taking? Authorizing Provider  ondansetron (ZOFRAN-ODT) 4 MG disintegrating tablet Allow 1-2 tablets to dissolve in your mouth every 8 hours as needed for nausea/vomiting 10/04/21  Yes Hinda Kehr, MD  brompheniramine-pseudoephedrine-DM 30-2-10 MG/5ML syrup Take 5 mLs by mouth 4 (four) times daily as needed. 08/25/18   Laban Emperor, PA-C  lidocaine (XYLOCAINE) 2 % solution Use as directed 10 mLs in the mouth or throat as needed for mouth pain. 08/25/18   Laban Emperor, PA-C    Allergies Patient has no known allergies.  Family History  Problem Relation Age of Onset   Breast cancer Maternal Grandmother     Breast cancer Paternal Grandmother     Social History Social History   Tobacco Use   Smoking status: Former    Packs/day: 1.00    Years: 15.00    Pack years: 15.00    Types: Cigarettes   Smokeless tobacco: Never  Vaping Use   Vaping Use: Never used  Substance Use Topics   Alcohol use: Yes   Drug use: Never    Review of Systems Constitutional: No fever/chills.  Positive for general malaise and generalized body aches. Eyes: No visual changes. ENT: Positive for mild sore throat.  Some nasal congestion. Cardiovascular: Denies chest pain. Respiratory: Denies shortness of breath.  Occasional cough. Gastrointestinal: No abdominal pain.  Positive for 2 episodes of vomiting.  No diarrhea.  No constipation. Genitourinary: Negative for dysuria. Musculoskeletal: Positive for generalized body aches.  Negative for neck pain.  Negative for back pain. Integumentary: Negative for rash. Neurological: Negative for headaches, focal weakness or numbness.   ____________________________________________   PHYSICAL EXAM:  VITAL SIGNS: ED Triage Vitals  Enc Vitals Group     BP 10/04/21 0025 131/84     Pulse Rate 10/04/21 0025 94     Resp 10/04/21 0025 18     Temp 10/04/21 0025 98.6 F (37 C)     Temp Source 10/04/21 0025 Oral     SpO2 10/04/21 0025 95 %     Weight 10/04/21 0023 70.3 kg (155 lb)     Height 10/04/21 0023 1.575 m (  5\' 2" )     Head Circumference --      Peak Flow --      Pain Score 10/04/21 0023 8     Pain Loc --      Pain Edu? --      Excl. in [place]? --     Constitutional: Alert and oriented.  Eyes: Conjunctivae are normal.  Head: Atraumatic. Nose: Mild congestion/rhinnorhea. Mouth/Throat: Patient is wearing a mask. Neck: No stridor.  No meningeal signs.   Cardiovascular: Normal rate, regular rhythm. Good peripheral circulation. Respiratory: Normal respiratory effort.  No retractions. Musculoskeletal: No gross deformities of extremities.  Weightbearing without  difficulty. Neurologic:  Normal speech and language. No gross focal neurologic deficits are appreciated.  Psychiatric: Mood and affect are normal. Speech and behavior are normal.  ____________________________________________   LABS (all labs ordered are listed, but only abnormal results are displayed)  Labs Reviewed  RESP PANEL BY RT-PCR (FLU A&B, COVID) ARPGX2 - Abnormal; Notable for the following components:      Result Value   SARS Coronavirus 2 by RT PCR POSITIVE (*)    All other components within normal limits  GROUP A STREP BY PCR   ____________________________________________   INITIAL IMPRESSION / MDM / ASSESSMENT AND PLAN / ED COURSE  As part of my medical decision making, I reviewed the following data within the [facility] notes reviewed and incorporated, Labs reviewed , Old chart reviewed, and Notes from prior ED visits   Differential diagnosis includes, but is not limited to, COVID-19, influenza, other nonspecific viral infection.  Vital signs are stable and within normal limits.  No indication of emergent condition at this time and her history of present illness suggests a viral illness.  Lab tests are negative for strep pharyngitis but positive for COVID-19.  I updated the patient with her results.  She is in no distress currently.  I talked with her about possibly prescribing paxlovid but she said she would rather just treat the symptoms so there is no indication for lab work to verify creatinine clearance at this time.  I gave my usual and customary management recommendations and return precautions.           ____________________________________________  FINAL CLINICAL IMPRESSION(S) / ED DIAGNOSES  Final diagnoses:  COVID-19     MEDICATIONS GIVEN DURING THIS VISIT:  Medications - No data to display   ED Discharge Orders          Ordered    ondansetron (ZOFRAN-ODT) 4 MG disintegrating tablet        10/04/21 0232              Note:  This document was prepared using Dragon voice recognition software and may include unintentional dictation errors.   Hinda Kehr, MD 10/04/21 [phone]

## 2021-10-04 NOTE — ED Triage Notes (Addendum)
Patient ambulatory to triage with steady gait, without difficulty or distress noted; pt reports body aches, nausea and chills and sore throat since yesterday

## 2021-10-04 NOTE — Discharge Instructions (Signed)
As we discussed, although you have tested positive for COVID-19 (coronavirus), you do not need to be hospitalized at this time.  Read through all the included information including the recommendations from the CDC.  We recommend that you self-quarantine at home with your immediate family only (people with whom you have already been in contact) for about a week after your fever has gone away (without taking medication to make your temperature come down, such as Tylenol (acetaminophen)), after your respiratory symptoms have improved.  You should have as minimal contact as possible with anyone else including close family as per the [facility] paperwork guidelines listed below. Follow-up with your doctor by phone or online as needed and return immediately to the emergency department or call 911 only if you develop new or worsening symptoms that concern you.  If you were prescribed any medications, please use them as instructed.  You can find up-to-date information about COVID-19 in [place] by calling the [facility]: [phone]. You may also call 2-1-1, or [phone], or additional resources.  You can also find information online at https://miller-white.com/, or on the Center for Disease Control (CDC) website at BeginnerSteps.be.

## 2023-11-20 ENCOUNTER — Ambulatory Visit: Payer: Medicaid Other

## 2023-11-20 ENCOUNTER — Ambulatory Visit: Payer: Medicaid Other | Admitting: Advanced Practice Midwife

## 2023-11-20 ENCOUNTER — Encounter: Payer: Self-pay | Admitting: Advanced Practice Midwife

## 2023-11-20 VITALS — BP 132/87 | HR 82 | Ht 62.0 in | Wt 133.4 lb

## 2023-11-20 DIAGNOSIS — Z720 Tobacco use: Secondary | ICD-10-CM

## 2023-11-20 DIAGNOSIS — Z309 Encounter for contraceptive management, unspecified: Secondary | ICD-10-CM | POA: Diagnosis not present

## 2023-11-20 DIAGNOSIS — F32A Depression, unspecified: Secondary | ICD-10-CM | POA: Insufficient documentation

## 2023-11-20 DIAGNOSIS — Z3009 Encounter for other general counseling and advice on contraception: Secondary | ICD-10-CM

## 2023-11-20 DIAGNOSIS — Z30432 Encounter for removal of intrauterine contraceptive device: Secondary | ICD-10-CM

## 2023-11-20 DIAGNOSIS — Z30431 Encounter for routine checking of intrauterine contraceptive device: Secondary | ICD-10-CM

## 2023-11-20 LAB — HM HEPATITIS C SCREENING LAB: HM Hepatitis Screen: NEGATIVE

## 2023-11-20 LAB — WET PREP FOR TRICH, YEAST, CLUE
Trichomonas Exam: NEGATIVE
Yeast Exam: NEGATIVE

## 2023-11-20 LAB — HM HIV SCREENING LAB: HM HIV Screening: NEGATIVE

## 2023-11-20 LAB — HEMOGLOBIN, FINGERSTICK: Hemoglobin: 15.3 g/dL (ref 11.1–15.9)

## 2023-11-20 NOTE — Progress Notes (Signed)
Pt here for annual physical and IUD removal.  Seeking pregnancy.  Wet mount results reviewed with pt.  No treatment needed at this time.  Hgb within normal limits.   Encouraged increased  fluid intake.  Provided March of Dimes booklet on pregnancy readiness.  Family planning packet given.  Condoms declined.-Collins Scotland, RN

## 2023-11-20 NOTE — Progress Notes (Signed)
[facility] HEALTH DEPARTMENT [facility] [address], [address] [place] [address] Main phone: [phone]  Family Planning Visit - Initial Visit  Subjective:  Carolyn Peters is a 33 y.o. MWF vaper  G2P2  (18, 13) being seen today for an initial annual visit and to discuss reproductive life planning.  The patient is currently using IUD or IUS for pregnancy prevention. Patient reports   does want a pregnancy in the next year.    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 IUD Mirena inserted maybe 5 years ago? Pt unsure? No menses with IUD. Last sex 11/16/23 without condom; with current partner x 6 years; 1 partner in last 3 months. Last cig 3 years ago. Vapes currently. Last MJ 33 yo. Last ETOH 08/2023 (3 beers). Last dental exam >6 years ago. Highest grade completed 12th. Working 15 hrs/wk. Living with her husband and 2 kids. Last pap can't remember.  Patient denies cigars  Review of Systems  All other systems reviewed and are negative.   Diabetes screening This patient is 33 y.o. with a BMI of Body mass index is 24.4 kg/m.Marland Kitchen  Is patient eligible for diabetes screening (age >35 and BMI >25)?  yes  Was Hgb A1c ordered? yes  STI screening Patient reports 1 of partners in last year.  Does this patient desire STI screening?  Yes  Hepatitis C screening Has patient been screened once for HCV in the past?  Yes  No results found for: "HCVAB"  Does the patient meet criteria for HCV testing? No  (If yes-- Screen for HCV through [facility]  Lab) Criteria:  Since the last HCV result, does the patient have any of the following? - Current drug use - Have a partner with drug use - Has been incarcerated  Hepatitis B screening Does the patient meet criteria for HBV testing? Yes Criteria:  -Household, sexual or needle sharing contact with HBV -History of drug use -HIV positive -Those with known Hep C  Cervical Cancer Screening  No Cervical Cancer Screening results to display.  Health Maintenance Due  Topic Date Due   HIV Screening  Never done   Hepatitis C Screening  Never done   DTaP/Tdap/Td (1 - Tdap) Never done   Cervical Cancer Screening (HPV/Pap Cotest)  Never done   INFLUENZA VACCINE  Never done   COVID-19 Vaccine (1 - 2024-25 season) Never done    The following portions of the patient's history were reviewed and updated as appropriate: allergies, current medications, past family history, past medical history, past social history, past surgical history and problem list. Problem list updated.  See flowsheet for other program required questions.  Objective:   Vitals:   11/20/23 1448  BP: 132/87  Pulse: 82  Weight: 133 lb 6.4 oz (60.5 kg)  Height: 5\' 2"  (1.575 m)    Physical Exam Constitutional:      Appearance: Normal appearance. She is normal weight.  HENT:     Head: Normocephalic and atraumatic.     Mouth/Throat:     Mouth: Mucous membranes are  moist.     Comments: Caries; last dental exam >6 years ago Eyes:     Conjunctiva/sclera: Conjunctivae normal.  Neck:     Thyroid: No thyroid mass, thyromegaly or thyroid tenderness.  Cardiovascular:     Rate and Rhythm: Normal rate and regular rhythm.  Pulmonary:     Effort: Pulmonary effort is normal.     Breath sounds: Normal breath sounds.  Chest:  Breasts:    Right: Normal.     Left: Normal.  Abdominal:     Palpations: Abdomen is soft.     Comments: Soft without masses or tenderness, fair tone  Genitourinary:    General: Normal vulva.      Exam position: Lithotomy position.     Pubic Area: No pubic lice.      Vagina: Vaginal discharge (white creamy leukorrhea, ph<4.5) present.     Cervix: Normal.     Uterus: Normal.      Adnexa: Right adnexa normal and left adnexa normal.     Rectum: Normal.     Comments: No evidence of nits Pap done Collins Scotland, RN chaperone for exam Musculoskeletal:        General: Normal range of motion.     Cervical back: Normal range of motion and neck supple.  Skin:    General: Skin is warm and dry.  Neurological:     Mental Status: She is alert.  Psychiatric:        Mood and Affect: Mood normal.     Assessment and Plan:  Carolyn Peters is a 33 y.o. female presenting to the [facility] Department for an initial annual wellness/contraceptive visit  Contraception counseling: Reviewed options based on patient desire and reproductive life plan. Patient is interested in No Method - No Contraceptive Precautions. This was provided to the patient today.  if not why not clearly documented  Risks, benefits, and typical effectiveness rates were reviewed.  Questions were answered.  Written information was also given to the patient to review.    The patient will follow up in  1 years for surveillance.  The patient was told to call with any further questions, or with any concerns about this method of contraception.  Emphasized use of condoms 100% of the time for STI prevention.  Educated on ECP and assessed for need of ECP. Patient reported not meeting criteria.  Reviewed options and patient desired No method of ECP, declined all    1. Family planning (Primary) Treat wet mount per standing orders Immunization nurse consult  - WET PREP FOR TRICH, YEAST, CLUE - Hemoglobin, venipuncture - Chlamydia/Gonorrhea [facility] - HIV/HCV [facility] - Syphilis Serology, [facility] - IGP, Aptima HPV - Hgb A1c w/o eAG  2. Encounter for management of intrauterine contraceptive device (IUD),  unspecified IUD management type   3. Encounter for removal of intrauterine contraceptive device (IUD)   IUD Removal  Patient identified, informed consent performed, consent signed.  Patient was in the dorsal lithotomy position, normal external genitalia was noted.  A speculum was placed in the patient's vagina, normal discharge was noted, no lesions. The cervix was visualized, no lesions, no abnormal discharge.  The strings of the IUD were grasped and pulled using ring forceps. The IUD was removed in its entirety.  Patient tolerated the procedure well.    Patient will use nothing for contraception/plans for pregnancy soon and she was told to avoid teratogens, take PNV and folic acid.  Routine preventative health maintenance measures  emphasized.   4. Vapes nicotine containing substance Counseled via 5 A's to stop  5. Depression, unspecified depression type Accepts contact card for Kathreen Cosier, LCSW   No follow-ups on file.  No future appointments.  Alberteen Spindle, CNM

## 2023-11-21 LAB — HGB A1C W/O EAG: Hgb A1c MFr Bld: 5.7 % — ABNORMAL HIGH (ref 4.8–5.6)

## 2023-11-25 ENCOUNTER — Telehealth: Payer: Self-pay

## 2023-11-25 LAB — IGP, APTIMA HPV
HPV Aptima: NEGATIVE
PAP Smear Comment: 0

## 2023-11-25 NOTE — Telephone Encounter (Signed)
No additional notes Carolyn Bellows, RN

## 2023-12-17 ENCOUNTER — Other Ambulatory Visit (HOSPITAL_BASED_OUTPATIENT_CLINIC_OR_DEPARTMENT_OTHER): Payer: Self-pay | Admitting: Physician Assistant

## 2023-12-17 DIAGNOSIS — N644 Mastodynia: Secondary | ICD-10-CM

## 2024-01-29 ENCOUNTER — Other Ambulatory Visit: Payer: Self-pay | Admitting: Physician Assistant

## 2024-01-29 DIAGNOSIS — N644 Mastodynia: Secondary | ICD-10-CM

## 2024-02-04 ENCOUNTER — Ambulatory Visit
Admission: RE | Admit: 2024-02-04 | Discharge: 2024-02-04 | Disposition: A | Discharge status: Home / Self Care | Source: Ambulatory Visit | Attending: Physician Assistant | Admitting: Physician Assistant

## 2024-02-04 ENCOUNTER — Encounter: Payer: Self-pay | Admitting: Radiology

## 2024-02-04 DIAGNOSIS — N644 Mastodynia: Secondary | ICD-10-CM | POA: Diagnosis present

## 2024-05-20 ENCOUNTER — Emergency Department

## 2024-05-20 ENCOUNTER — Encounter: Payer: Self-pay | Admitting: Emergency Medicine

## 2024-05-20 ENCOUNTER — Other Ambulatory Visit: Payer: Self-pay

## 2024-05-20 ENCOUNTER — Emergency Department
Admission: EM | Admit: 2024-05-20 | Discharge: 2024-05-20 | Disposition: A | Discharge status: Home / Self Care | Attending: Emergency Medicine | Admitting: Emergency Medicine

## 2024-05-20 DIAGNOSIS — O209 Hemorrhage in early pregnancy, unspecified: Secondary | ICD-10-CM | POA: Diagnosis present

## 2024-05-20 DIAGNOSIS — Z3A01 Less than 8 weeks gestation of pregnancy: Secondary | ICD-10-CM | POA: Insufficient documentation

## 2024-05-20 DIAGNOSIS — O3680X0 Pregnancy with inconclusive fetal viability, not applicable or unspecified: Secondary | ICD-10-CM

## 2024-05-20 LAB — BASIC METABOLIC PANEL WITH GFR
Anion gap: 10 (ref 5–15)
BUN: 12 mg/dL (ref 6–20)
CO2: 26 mmol/L (ref 22–32)
Calcium: 9.3 mg/dL (ref 8.9–10.3)
Chloride: 102 mmol/L (ref 98–111)
Creatinine, Ser: 0.69 mg/dL (ref 0.44–1.00)
GFR, Estimated: >60 mL/min (ref >60)
Glucose, Bld: 103 mg/dL — ABNORMAL HIGH (ref 70–99)
Potassium: 3.4 mmol/L — ABNORMAL LOW (ref 3.5–5.1)
Sodium: 138 mmol/L (ref 135–145)

## 2024-05-20 LAB — URINALYSIS, ROUTINE W REFLEX MICROSCOPIC
Bilirubin Urine: NEGATIVE
Glucose, UA: NEGATIVE mg/dL
Ketones, ur: NEGATIVE mg/dL
Leukocytes,Ua: NEGATIVE
Nitrite: NEGATIVE
Protein, ur: NEGATIVE mg/dL
Specific Gravity, Urine: 1.004 — ABNORMAL LOW (ref 1.005–1.030)
pH: 6 (ref 5.0–8.0)

## 2024-05-20 LAB — HCG, QUANTITATIVE, PREGNANCY: hCG, Beta Chain, Quant, S: 15 m[IU]/mL — ABNORMAL HIGH (ref <5)

## 2024-05-20 LAB — CBC
HCT: 44.8 % (ref 36.0–46.0)
Hemoglobin: 14.8 g/dL (ref 12.0–15.0)
MCH: 32.9 pg (ref 26.0–34.0)
MCHC: 33 g/dL (ref 30.0–36.0)
MCV: 99.6 fL (ref 80.0–100.0)
Platelets: 407 K/uL — ABNORMAL HIGH (ref 150–400)
RBC: 4.5 MIL/uL (ref 3.87–5.11)
RDW: 12.6 % (ref 11.5–15.5)
WBC: 11.1 K/uL — ABNORMAL HIGH (ref 4.0–10.5)
nRBC: 0 % (ref 0.0–0.2)

## 2024-05-20 LAB — POC URINE PREG, ED: Preg Test, Ur: NEGATIVE

## 2024-05-20 LAB — ABO/RH: ABO/RH(D): A POS

## 2024-05-20 NOTE — Discharge Instructions (Addendum)
 I suspect that this was a miscarriage but given this is technically a pregnancy of unknown location we do recommend a repeat hCG level typically in 48 hours but given your history I think it be reasonable to wait till Monday as long as you are not having any severe worsening bleeding, severe pain or any other concerns.  Want to make sure that this level goes back down to 0.  You should return to the ER for any of these above concerns.  No intrauterine gestational sac, yolk sac, or fetal pole identified.  Differential considerations include intrauterine pregnancy too early  to be sonographically visualized, missed abortion, or ectopic  pregnancy. Followup ultrasound is recommended in 10-14 days for  further evaluation.

## 2024-05-20 NOTE — ED Triage Notes (Addendum)
 Pt via POV from home. Pt c/o vaginal bleeding that started last night. Reports LMP was 6/30. Tested positive with her PCP on Monday and was told she was about [redacted] weeks pregnant. Pt has a hx of miscarriage in the past. Pt is A&Ox4 and NAD, ambulatory to triage with steady gait.

## 2024-05-20 NOTE — ED Notes (Signed)
 Pink top sent to lab for ABO/Rh if needed.

## 2024-05-20 NOTE — ED Provider Notes (Signed)
 [facility] Provider Note    Event Date/Time   First MD Initiated Contact with Patient 05/20/24 1327     (approximate)   History   Vaginal Bleeding   HPI  Carolyn Peters is a 33 y.o. female who comes in with vaginal bleeding that started last night.  She reports her LMP was on 6/30.  Did have a positive pregnancy test on Monday.  Patient has had prior miscarriage.  Patient reports she has had 2 live births, 1 recent miscarriage and now with concerns for another miscarriage.  She denies any history of ectopic pregnancy she denies any pain.  She reports bleeding started yesterday and feels like a period.  Denies any more than a pad an hour.  She denies any vaginal discharge new sexual partners or concerns for STDs.   Physical Exam   Triage Vital Signs: ED Triage Vitals  Encounter Vitals Group     BP 05/20/24 1306 (!) 156/89     Girls Systolic BP Percentile --      Girls Diastolic BP Percentile --      Boys Systolic BP Percentile --      Boys Diastolic BP Percentile --      Pulse Rate 05/20/24 1306 71     Resp 05/20/24 1306 18     Temp 05/20/24 1306 97.6 F (36.4 C)     Temp Source 05/20/24 1306 Oral     SpO2 05/20/24 1306 98 %     Weight 05/20/24 1305 134 lb (60.8 kg)     Height 05/20/24 1305 5' 2 (1.575 m)     Head Circumference --      Peak Flow --      Pain Score 05/20/24 1305 0     Pain Loc --      Pain Education --      Exclude from Growth Chart --     Most recent vital signs: Vitals:   05/20/24 1306  BP: (!) 156/89  Pulse: 71  Resp: 18  Temp: 97.6 F (36.4 C)  SpO2: 98%     General: Awake, no distress.  CV:  Good peripheral perfusion.  Resp:  Normal effort.  Abd:  No distention.  Soft and nontender Other:     ED Results / Procedures / Treatments   Labs (all labs ordered are listed, but only abnormal results are displayed) Labs Reviewed  CBC - Abnormal; Notable for the following components:      Result Value   WBC  11.1 (*)    Platelets 407 (*)    All other components within normal limits  URINALYSIS, ROUTINE W REFLEX MICROSCOPIC - Abnormal; Notable for the following components:   Color, Urine STRAW (*)    APPearance CLEAR (*)    Specific Gravity, Urine 1.004 (*)    Hgb urine dipstick MODERATE (*)    Bacteria, UA RARE (*)    All other components within normal limits  BASIC METABOLIC PANEL WITH GFR  HCG, QUANTITATIVE, PREGNANCY  POC URINE PREG, ED  ABO/RH    RADIOLOGY I have reviewed the ultrasound personally reviewed and interpreted without evidence of IUP   PROCEDURES:  Critical Care performed: No  Procedures   MEDICATIONS ORDERED IN ED: Medications - No data to display   IMPRESSION / MDM / ASSESSMENT AND PLAN / ED COURSE  I reviewed the triage vital signs and the nursing notes.   Patient's presentation is most consistent with acute presentation with potential threat to life  or bodily function.   Differential includes pregnancy, ectopic pregnancy, miscarriage  Preg test was negative but will add on hCG to see if patient has any pregnancy hormone still in her.  CBC to rule out any anemia was negative.  Urine without evidence of UTI some blood in it most likely from vaginal bleeding.  Patient denies any concerns for STDs no indication for testing.  Her pregnancy test in the blood was slightly elevated at 15.  Ultrasound without evidence of IUP or ectopic.  Discussed with patient that I do suspect that this is related to a miscarriage given she had a positive urine test before and now it is negative this is suggestive of her hCG levels falling we cannot know for sure and technically this is still considered a pregnancy of unknown location and so she needs a repeat hCG to ensure that the levels are coming back down to 0.  We discussed that typically this is done in 48 hours but given her numbers so low and my higher suspicion that this is a miscarriage that it would be reasonable for her  to just wait and follow-up on Monday but we discussed return precautions in regards to ectopic.  She expressed understanding felt comfortable with this plan.  Patient's blood type is positive and she has no evidence of UTI     FINAL CLINICAL IMPRESSION(S) / ED DIAGNOSES   Final diagnoses:  Pregnancy of unknown anatomic location     Rx / DC Orders   ED Discharge Orders     None        Note:  This document was prepared using Dragon voice recognition software and may include unintentional dictation errors.   Ernest Ronal BRAVO, MD 05/20/24 [phone]

## 2024-05-26 ENCOUNTER — Other Ambulatory Visit

## 2024-05-26 ENCOUNTER — Telehealth: Payer: Self-pay | Admitting: Obstetrics and Gynecology

## 2024-05-26 NOTE — Telephone Encounter (Signed)
 Reached out to pt to reschedule lab appt that was scheduled on 05/26/2024 at 2:40.  Left message for pt to call back.

## 2024-05-28 NOTE — Telephone Encounter (Signed)
 Reached out to pt to reschedule lab appt that was scheduled on 05/26/2024 at 2:40.  Was able to reschedule the pt to 05/31/2024 at 2:20.

## 2024-05-31 ENCOUNTER — Other Ambulatory Visit

## 2024-05-31 DIAGNOSIS — O209 Hemorrhage in early pregnancy, unspecified: Secondary | ICD-10-CM

## 2024-06-01 LAB — BETA HCG QUANT (REF LAB): hCG Quant: <1 m[IU]/mL

## 2024-06-04 ENCOUNTER — Ambulatory Visit

## 2024-06-28 ENCOUNTER — Other Ambulatory Visit

## 2024-07-12 ENCOUNTER — Encounter: Admitting: Obstetrics
